# Patient Record
Sex: Female | Born: 1995 | Race: Black or African American | Hispanic: No | Marital: Married | Smoking: Never smoker
Health system: Southern US, Community
[De-identification: ages and names within clinical notes are randomized; demographics above are authoritative.]

---

## 2018-10-31 ENCOUNTER — Other Ambulatory Visit: Payer: Self-pay | Admitting: Family Medicine

## 2018-10-31 DIAGNOSIS — N6310 Unspecified lump in the right breast, unspecified quadrant: Secondary | ICD-10-CM

## 2018-11-05 ENCOUNTER — Ambulatory Visit
Admission: RE | Admit: 2018-11-05 | Discharge: 2018-11-05 | Disposition: A | Discharge status: Home / Self Care | Payer: BC Managed Care – PPO | Source: Ambulatory Visit | Attending: Family Medicine | Admitting: Family Medicine

## 2018-11-05 ENCOUNTER — Other Ambulatory Visit: Payer: Self-pay | Admitting: Family Medicine

## 2018-11-05 ENCOUNTER — Other Ambulatory Visit: Payer: Self-pay

## 2018-11-05 DIAGNOSIS — N6310 Unspecified lump in the right breast, unspecified quadrant: Secondary | ICD-10-CM

## 2019-02-06 ENCOUNTER — Other Ambulatory Visit: Payer: BC Managed Care – PPO

## 2019-05-08 ENCOUNTER — Other Ambulatory Visit: Payer: BC Managed Care – PPO

## 2021-02-24 ENCOUNTER — Ambulatory Visit (INDEPENDENT_AMBULATORY_CARE_PROVIDER_SITE_OTHER): Payer: BLUE CROSS/BLUE SHIELD | Admitting: Allergy & Immunology

## 2021-02-24 ENCOUNTER — Encounter: Payer: Self-pay | Admitting: Allergy & Immunology

## 2021-02-24 ENCOUNTER — Other Ambulatory Visit: Payer: Self-pay

## 2021-02-24 VITALS — BP 124/70 | HR 80 | Temp 97.6°F | Resp 18 | Ht 64.0 in | Wt 161.8 lb

## 2021-02-24 DIAGNOSIS — L508 Other urticaria: Secondary | ICD-10-CM | POA: Insufficient documentation

## 2021-02-24 DIAGNOSIS — J3089 Other allergic rhinitis: Secondary | ICD-10-CM

## 2021-02-24 DIAGNOSIS — J302 Other seasonal allergic rhinitis: Secondary | ICD-10-CM

## 2021-02-24 DIAGNOSIS — T7840XA Allergy, unspecified, initial encounter: Secondary | ICD-10-CM | POA: Diagnosis not present

## 2021-02-24 DIAGNOSIS — T7840XD Allergy, unspecified, subsequent encounter: Secondary | ICD-10-CM

## 2021-02-24 NOTE — Patient Instructions (Addendum)
1. Chronic urticaria - We are going to hold off on labs today. - We will see if your allergies that were positive today account for control of your symptoms. - We can certainly do testing in the future if needed. - I would start a daily antihistamine and try to be more consistent with it to suppress these reactions and treat your allergies.   2. Chronic rhinitis - Testing today showed: grasses, ragweed, weeds, trees, indoor molds, dust mites, cat, and dog. - Copy of test results provided.  - Avoidance measures provided. - Continue with: a daily antihistamine (alternate every 3 months in case you develop tolerance to one) - Start taking: Nasacort (triamcinolone) one spray per nostril daily - You can use an extra dose of the antihistamine, if needed, for breakthrough symptoms.  - Consider nasal saline rinses 1-2 times daily to remove allergens from the nasal cavities as well as help with mucous clearance (this is especially helpful to do before the nasal sprays are given) - Consider allergy shots as a means of long-term control. - Allergy shots "re-train" and "reset" the immune system to ignore environmental allergens and decrease the resulting immune response to those allergens (sneezing, itchy watery eyes, runny nose, nasal congestion, etc).    - Allergy shots improve symptoms in 75-85% of patients.  - We can discuss more at the next appointment if the medications are not working for you.  3. Allergic reaction - I think you reactions are likely from the dust mite exposure. - Get dust mite covers at least for the pillows. - Testing to the most common food allergens as well as apple were negative. - There is a the low positive predictive value of food allergy testing and hence the high possibility of false positives. - In contrast, food allergy testing has a high negative predictive value, therefore if testing is negative we can be relatively assured that they are indeed negative.   4. Return  in about 6 weeks (around 04/07/2021).    Please inform us of any Emergency Department visits, hospitalizations, or changes in symptoms. Call us before going to the ED for breathing or allergy symptoms since we might be able to fit you in for a sick visit. Feel free to contact us anytime with any questions, problems, or concerns.  It was a pleasure to meet you today!  Websites that have reliable patient information: 1. American Academy of Asthma, Allergy, and Immunology: www.aaaai.org 2. Food Allergy Research and Education (FARE): foodallergy.org 3. Mothers of Asthmatics: http://www.asthmacommunitynetwork.org 4. American College of Allergy, Asthma, and Immunology: www.acaai.org   COVID-19 Vaccine Information can be found at: [email or web address] For questions related to vaccine distribution or appointments, please email [email or web address] or call [phone].   We realize that you might be concerned about having an allergic reaction to the COVID19 vaccines. To help with that concern, WE ARE OFFERING THE COVID19 VACCINES IN OUR OFFICE! Ask the front desk for dates!     Like [place] on [employer] and Instagram for our latest updates!      A healthy democracy works best when [employer] participate! Make sure you are registered to vote! If you have moved or changed any of your contact information, you will need to get this updated before voting!  In some cases, you MAY be able to register to vote online: [email or web address]    Airborne Adult Perc - 02/24/21 0932     Number of Test 59    Panel 1 Select    1.  Control-Buffer 50% Glycerol Negative    2. Control-Histamine 1 mg/ml 2+    3. Albumin saline Negative    4. Bahia 2+    5. [place] Negative    6. Johnson 3+    7. Kentucky Blue 3+    8. Meadow Fescue 3+    9. Perennial Rye 3+    10. Sweet Vernal 2+    11. Timothy Negative    12. Cocklebur  Negative    13. Burweed Marshelder Negative    14. Ragweed, short Negative    15. Ragweed, Giant Negative    16. Plantain,  English Negative    17. Lamb's Quarters Negative    18. Sheep Sorrell Negative    19. Rough Pigweed Negative    20. Marsh Elder, Rough Negative    21. Mugwort, Common 3+    22. Ash mix Negative    23. Birch mix 3+    24. Beech American 3+    25. Box, Elder Negative    26. Cedar, red Negative    27. Cottonwood, Guinea-[place]issau Negative    28. Elm mix Negative    29. Hickory Negative    30. Maple mix Negative    31. Oak, [place] mix Negative    32. Pecan Pollen Negative    33. Pine mix Negative    34. Sycamore Eastern Negative    35. Walnut, Black Pollen Negative    36. Alternaria alternata Negative    37. Cladosporium Herbarum Negative    38. Aspergillus mix Negative    39. Penicillium mix Negative    40. Bipolaris sorokiniana (Helminthosporium) Negative    41. Drechslera spicifera (Curvularia) Negative    42. Mucor plumbeus Negative    43. Fusarium moniliforme Negative    44. Aureobasidium pullulans (pullulara) Negative    45. Rhizopus oryzae Negative    46. Botrytis cinera Negative    47. Epicoccum nigrum Negative    48. Phoma betae Negative    49. Candida Albicans Negative    50. Trichophyton mentagrophytes Negative    51. Mite, D Farinae  5,000 AU/ml Negative    52. Mite, D Pteronyssinus  5,000 AU/ml Negative    53. Cat Hair 10,000 BAU/ml Negative    54.  Dog Epithelia Negative    55. Mixed Feathers Negative    56. Horse Epithelia Negative    57. Cockroach, German Negative    58. Mouse Negative    59. Tobacco Leaf Negative             Intradermal - 02/24/21 1039     Time Antigen Placed 1020    Allergen Manufacturer Greer    Location Arm    Number of Test 11    Intradermal Select    Control Negative    [place] 2+    Ragweed mix 1+    Mold 1 Negative    Mold 2 Negative    Mold 3 Negative    Mold 4 1+    Cat 4+    Dog 3+    Cockroach  Negative    Mite mix 2+             Food Adult Perc - 02/24/21 1000     Control-Histamine 1 mg/ml 2+    1. Peanut Negative    2. Soybean Negative    3. Wheat Negative    4. Sesame Negative    5. Milk, cow Negative    6. Egg White, Chicken Negative  7. Casein Negative    8. Shellfish Mix Negative    9. Fish Mix Negative    10. Cashew Negative    11. Pecan Food Negative    12. Walnut Food Negative    13. Almond Negative    14. Hazelnut Negative    15. [place] nut Negative    16. Coconut Negative    17. Pistachio Negative    58. Apple Negative             Reducing Pollen Exposure  The American Academy of Allergy, Asthma and Immunology suggests the following steps to reduce your exposure to pollen during allergy seasons.    Do not hang sheets or clothing out to dry; pollen may collect on these items. Do not mow lawns or spend time around freshly cut grass; mowing stirs up pollen. Keep windows closed at night.  Keep car windows closed while driving. Minimize morning activities outdoors, a time when pollen counts are usually at their highest. Stay indoors as much as possible when pollen counts or humidity is high and on windy days when pollen tends to remain in the air longer. Use air conditioning when possible.  Many air conditioners have filters that trap the pollen spores. Use a HEPA room air filter to remove pollen form the indoor air you breathe.  Control of Dust Mite Allergen    Dust mites play a major role in allergic asthma and rhinitis.  They occur in environments with high humidity wherever human skin is found.  Dust mites absorb humidity from the atmosphere (ie, they do not drink) and feed on organic matter (including shed human and animal skin).  Dust mites are a microscopic type of insect that you cannot see with the naked eye.  High levels of dust mites have been detected from mattresses, pillows, carpets, upholstered furniture, bed covers, clothes, soft  toys and any woven material.  The principal allergen of the dust mite is found in its feces.  A gram of dust may contain 1,000 mites and 250,000 fecal particles.  Mite antigen is easily measured in the air during house cleaning activities.  Dust mites do not bite and do not cause harm to humans, other than by triggering allergies/asthma.    Ways to decrease your exposure to dust mites in your home:  Encase mattresses, box springs and pillows with a mite-impermeable barrier or cover   Wash sheets, blankets and drapes weekly in hot water (130 F) with detergent and dry them in a dryer on the hot setting.  Have the room cleaned frequently with a vacuum cleaner and a damp dust-mop.  For carpeting or rugs, vacuuming with a vacuum cleaner equipped with a high-efficiency particulate air (HEPA) filter.  The dust mite allergic individual should not be in a room which is being cleaned and should wait 1 hour after cleaning before going into the room. Do not sleep on upholstered furniture (eg, couches).   If possible removing carpeting, upholstered furniture and drapery from the home is ideal.  Horizontal blinds should be eliminated in the rooms where the person spends the most time (bedroom, study, television room).  Washable vinyl, roller-type shades are optimal. Remove all non-washable stuffed toys from the bedroom.  Wash stuffed toys weekly like sheets and blankets above.   Reduce indoor humidity to less than 50%.  Inexpensive humidity monitors can be purchased at most hardware stores.  Do not use a humidifier as can make the problem worse and are not recommended.  Control of Dog or Cat Allergen  Avoidance is the best way to manage a dog or cat allergy. If you have a dog or cat and are allergic to dog or cats, consider removing the dog or cat from the home. If you have a dog or cat but dont want to find it a new home, or if your family wants a pet even though someone in the household is allergic, here are  some strategies that may help keep symptoms at bay:  Keep the pet out of your bedroom and restrict it to only a few rooms. Be advised that keeping the dog or cat in only one room will not limit the allergens to that room. Dont pet, hug or kiss the dog or cat; if you do, wash your hands with soap and water. High-efficiency particulate air (HEPA) cleaners run continuously in a bedroom or living room can reduce allergen levels over time. Regular use of a high-efficiency vacuum cleaner or a central vacuum can reduce allergen levels. Giving your dog or cat a bath at least once a week can reduce airborne allergen.

## 2021-02-24 NOTE — Progress Notes (Signed)
NEW PATIENT  Date of Service/Encounter:  02/24/21  Consult requested by: Virgilio Belling, PA-C   Assessment:   Chronic urticaria  Seasonal and perennial allergic rhinitis (grasses, ragweed, weeds, trees, indoor molds, dust mites, cat, and dog)  Allergic reaction - unknown trigger but maybe dust mites?   Plan/Recommendations:   1. Chronic urticaria - We are going to hold off on labs today. - We will see if your allergies that were positive today account for control of your symptoms. - We can certainly do testing in the future if needed. - I would start a daily antihistamine and try to be more consistent with it to suppress these reactions and treat your allergies.   2. Chronic rhinitis - Testing today showed: grasses, ragweed, weeds, trees, indoor molds, dust mites, cat, and dog. - Copy of test results provided.  - Avoidance measures provided. - Continue with: a daily antihistamine (alternate every 3 months in case you develop tolerance to one) - Start taking: Nasacort (triamcinolone) one spray per nostril daily - You can use an extra dose of the antihistamine, if needed, for breakthrough symptoms.  - Consider nasal saline rinses 1-2 times daily to remove allergens from the nasal cavities as well as help with mucous clearance (this is especially helpful to do before the nasal sprays are given) - Consider allergy shots as a means of long-term control. - Allergy shots "re-train" and "reset" the immune system to ignore environmental allergens and decrease the resulting immune response to those allergens (sneezing, itchy watery eyes, runny nose, nasal congestion, etc).    - Allergy shots improve symptoms in 75-85% of patients.  - We can discuss more at the next appointment if the medications are not working for you.  3. Allergic reaction - I think you reactions are likely from the dust mite exposure. - Get dust mite covers at least for the pillows. - Testing to the most  common food allergens as well as apple were negative. - There is a the low positive predictive value of food allergy testing and hence the high possibility of false positives. - In contrast, food allergy testing has a high negative predictive value, therefore if testing is negative we can be relatively assured that they are indeed negative.   4. Return in about 6 weeks (around 04/07/2021).    This note in its entirety was forwarded to the Provider who requested this consultation.  Subjective:   Laura Barker is a 25 y.o. female presenting today for evaluation of  Chief Complaint  Patient presents with   Urticaria   Allergy Testing    Environmental Food???    Laura Barker has a history of the following: Patient Active Problem List   Diagnosis Date Noted   Chronic urticaria 02/24/2021   Seasonal and perennial allergic rhinitis 02/24/2021   Allergic reaction 02/24/2021    History obtained from: chart review and patient.  Laura Barker was referred by Virgilio Belling, PA-C.     Laura Barker is a 25 y.o. female presenting for an evaluation of allergic reactions and lip swelling . 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    Asthma/Respiratory Symptom History: She had asthma as a child. She had bronchitis in college around 3-4 years. She went to [employer] [facility] [employer] [facility] [employer] and greww up in [place]. She has not needed an inhaler since that time at all.  She has no issues with working out and coughing at night.   Allergic Rhinitis Symptom History: She recently moved  into a new house in July 2022. She does have environmental allergies including dust and pollen. She does dust often and she changes the filters. She has carpeting in her bedroom and hardwood flooring everywhere else.  She takes an OTC antihistamine like Benadryl or Zyrtec. She does have a nose spray but she does not use routinely. She has never been allergy tested.   Food Allergy Symptom History: In September  2022, she tried an Musician. She woke up the next morning and she had lip swelling. She drank it around 8pm the night prior. She did not start any new medications. She never changed her diet.  She had episodes on December 10th and 19th. These WERE NOT associated with the ciders. But these most recent two only included her eyes and not her lips. These are always in the morning. There are no animals in the home. These were tingling if anything. She went to see her PCP. The swelling sticks around for a period of around 4 or 5 pm that evening, lasting an entire. She treated with Benadryl. She never needed prednisone. She is eating more steak than ground beef. She denies tick bites, but she did go to the [place] in August and she had some bites, which she presumed were ants or bed bugs.   Skin Symptom History: She does break out in hives when the seasons change. She has hives over her hands and body. She has gone to the dermatologist and this was diagnosed as hand dermatitis. She has put Aquaphor as  well as an anti itching cream.   Otherwise, there is no history of other atopic diseases, including asthma, drug allergies, stinging insect allergies, or contact dermatitis. There is no significant infectious history. Vaccinations are up to date.     Past Medical History: Patient Active Problem List   Diagnosis Date Noted   Chronic urticaria 02/24/2021   Seasonal and perennial allergic rhinitis 02/24/2021   Allergic reaction 02/24/2021    Medication List:  Allergies as of 02/24/2021   No Known Allergies      Medication List        Accurate as of February 24, 2021  1:18 PM. If you have any questions, ask your nurse or doctor.          cetirizine 10 MG tablet Commonly known as: ZYRTEC Take 1 tablet by mouth as needed.   Lo Loestrin Fe 1 MG-10 MCG / 10 MCG tablet Generic drug: Norethindrone-Ethinyl Estradiol-Fe Biphas Take 1 tablet by mouth daily.        Birth  History: non-contributory  Developmental History: non-contributory  Past Surgical History: History reviewed. No pertinent surgical history.   Family History: History reviewed. No pertinent family history.   Social History: Aqua lives at home with her partner. They live in a town home that is [address] years old. There are hardwood floors in the main living areas and carpeting in the bedrooms. They have gas heating and central cooling. There are dust mite coverings on the bedding at all. There is no tobacco exposure. 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 There is no HEPA filter in the home.    Review of Systems  Constitutional: Negative.  Negative for chills, fever, malaise/fatigue and weight loss.  HENT: Negative.  Negative for congestion, ear discharge, ear pain and sore throat.   Eyes:  Negative for pain, discharge and redness.  Respiratory:  Positive for cough. Negative for sputum production, shortness of breath  and wheezing.   Cardiovascular: Negative.  Negative for chest pain and palpitations.  Gastrointestinal:  Negative for abdominal pain, constipation, diarrhea, heartburn, nausea and vomiting.  Skin: Negative.  Negative for itching and rash.       Positive for lip swelling.   Neurological:  Negative for dizziness and headaches.  Endo/Heme/Allergies:  Negative for environmental allergies. Does not bruise/bleed easily.      Objective:   Blood pressure 124/70, pulse 80, temperature 97.6 F (36.4 C), resp. rate 18, height  (1.626 m), weight 161 lb 12.8 oz (73.4 kg), SpO2 98 %. Body mass index is 27.77 kg/m.   Physical Exam:   Physical Exam Vitals reviewed.  Constitutional:      Appearance: She is well-developed.  HENT:     Head: Normocephalic and atraumatic.     Right Ear: Tympanic membrane, ear canal and external ear normal. No drainage, swelling or tenderness. Tympanic membrane is not injected, scarred, erythematous, retracted or bulging.      Left Ear: Tympanic membrane, ear canal and external ear normal. No drainage, swelling or tenderness. Tympanic membrane is not injected, scarred, erythematous, retracted or bulging.     Nose: Mucosal edema and rhinorrhea present. No nasal deformity or septal deviation.     Right Turbinates: Enlarged, swollen and pale.     Left Turbinates: Enlarged, swollen and pale.     Right Sinus: No maxillary sinus tenderness or frontal sinus tenderness.     Left Sinus: No maxillary sinus tenderness or frontal sinus tenderness.     Mouth/Throat:     Mouth: Mucous membranes are not pale and not dry.     Pharynx: Uvula midline.  Eyes:     General:        Right eye: No discharge.        Left eye: No discharge.     Conjunctiva/sclera: Conjunctivae normal.     Right eye: Right conjunctiva is not injected. No chemosis.    Left eye: Left conjunctiva is not injected. No chemosis.    Pupils: Pupils are equal, round, and reactive to light.  Cardiovascular:     Rate and Rhythm: Normal rate and regular rhythm.     Heart sounds: Normal heart sounds.  Pulmonary:     Effort: Pulmonary effort is normal. No tachypnea, accessory muscle usage or respiratory distress.     Breath sounds: Normal breath sounds. No wheezing, rhonchi or rales.     Comments: Moving air well in all lung fields. No increased work of breathing.  Chest:     Chest wall: No tenderness.  Abdominal:     Tenderness: There is no abdominal tenderness. There is no guarding or rebound.  Lymphadenopathy:     Head:     Right side of head: No submandibular, tonsillar or occipital adenopathy.     Left side of head: No submandibular, tonsillar or occipital adenopathy.     Cervical: No cervical adenopathy.  Skin:    General: Skin is warm.     Capillary Refill: Capillary refill takes less than 2 seconds.     Coloration: Skin is not pale.     Findings: No abrasion, erythema, petechiae or rash. Rash is not papular, urticarial or vesicular.   Neurological:     Mental Status: She is alert.  Psychiatric:        Behavior: Behavior is cooperative.     Diagnostic studies:    Allergy Studies:     Airborne Adult Perc - 02/24/21 0932  Number of Test 59    Panel 1 Select    1. Control-Buffer 50% Glycerol Negative    2. Control-Histamine 1 mg/ml 2+    3. Albumin saline Negative    4. Bahia 2+    5. French [place] Territ[place] Negative    6. Johnson 3+    7. Kentucky Blue 3+    8. Meadow Fescue 3+    9. Perennial Rye 3+    10. Sweet Vernal 2+    11. Timothy Negative    12. Cocklebur Negative    13. Burweed Marshelder Negative    14. Ragweed, short Negative    15. Ragweed, Giant Negative    16. Plantain,  English Negative    17. Lamb'Laura Quarters Negative    18. Sheep Sorrell Negative    19. Rough Pigweed Negative    20. Marsh Elder, Rough Negative    21. Mugwort, Common 3+    22. Ash mix Negative    23. Birch mix 3+    24. Beech American 3+    25. Box, Elder Negative    26. Cedar, red Negative    27. Cottonwood, Guinea-[place]issau Negative    28. Elm mix Negative    29. Hickory Negative    30. Maple mix Negative    31. Oak, [place] mix Negative    32. Pecan Pollen Negative    33. Pine mix Negative    34. Sycamore Eastern Negative    35. Walnut, Black Pollen Negative    36. Alternaria alternata Negative    37. Cladosporium Herbarum Negative    38. Aspergillus mix Negative    39. Penicillium mix Negative    40. Bipolaris sorokiniana (Helminthosporium) Negative    41. Drechslera spicifera (Curvularia) Negative    42. Mucor plumbeus Negative    43. Fusarium moniliforme Negative    44. Aureobasidium pullulans (pullulara) Negative    45. Rhizopus oryzae Negative    46. Botrytis cinera Negative    47. Epicoccum nigrum Negative    48. Phoma betae Negative    49. Candida Albicans Negative    50. Trichophyton mentagrophytes Negative    51. Mite, D Farinae  5,000 AU/ml Negative    52. Mite, D Pteronyssinus  5,000 AU/ml Negative    53.  Cat Hair 10,000 BAU/ml Negative    54.  Dog Epithelia Negative    55. Mixed Feathers Negative    56. Horse Epithelia Negative    57. Cockroach, German Negative    58. Mouse Negative    59. Tobacco Leaf Negative             Intradermal - 02/24/21 1039     Time Antigen Placed 1020    Allergen Manufacturer Greer    Location Arm    Number of Test 11    Intradermal Select    Control Negative    [place] 2+    Ragweed mix 1+    Mold 1 Negative    Mold 2 Negative    Mold 3 Negative    Mold 4 1+    Cat 4+    Dog 3+    Cockroach Negative    Mite mix 2+             Food Adult Perc - 02/24/21 1000     Control-Histamine 1 mg/ml 2+    1. Peanut Negative    2. Soybean Negative    3. Wheat Negative    4. Sesame Negative    5. Milk,  cow Negative    6. Egg White, Chicken Negative    7. Casein Negative    8. Shellfish Mix Negative    9. Fish Mix Negative    10. Cashew Negative    11. Pecan Food Negative    12. Walnut Food Negative    13. Almond Negative    14. Hazelnut Negative    15. [place] nut Negative    16. Coconut Negative    17. Pistachio Negative    58. Apple Negative             Allergy testing results were read and interpreted by myself, documented by clinical staff.         Malachi Bonds, MD [facility]

## 2021-03-21 IMAGING — US US BREAST*R* LIMITED INC AXILLA
1 series · 5 of 5 positions shown · non-contrast
Comparison: None

CLINICAL DATA: Patient presents for palpable abnormality within the
outer right breast.

EXAM:
ULTRASOUND OF THE RIGHT BREAST

[Series 1: us breast*right* limited inc axilla · 0.07mm/px · 5 of 5 slices shown]
[im 1/5]
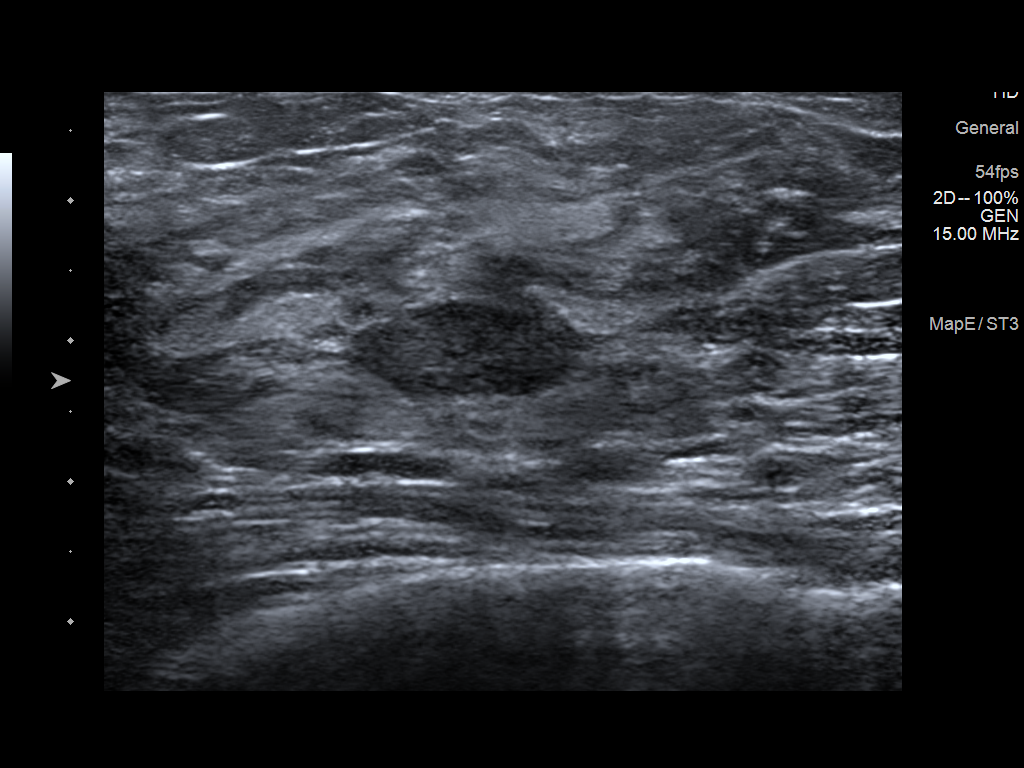
[im 2/5]
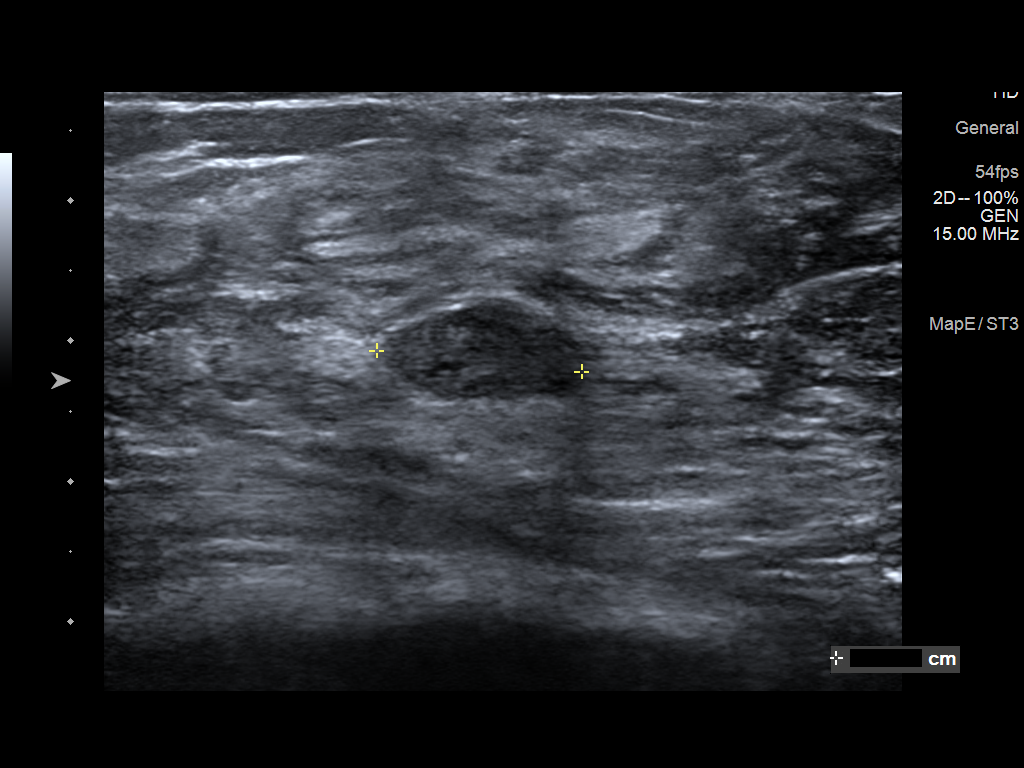
[im 3/5]
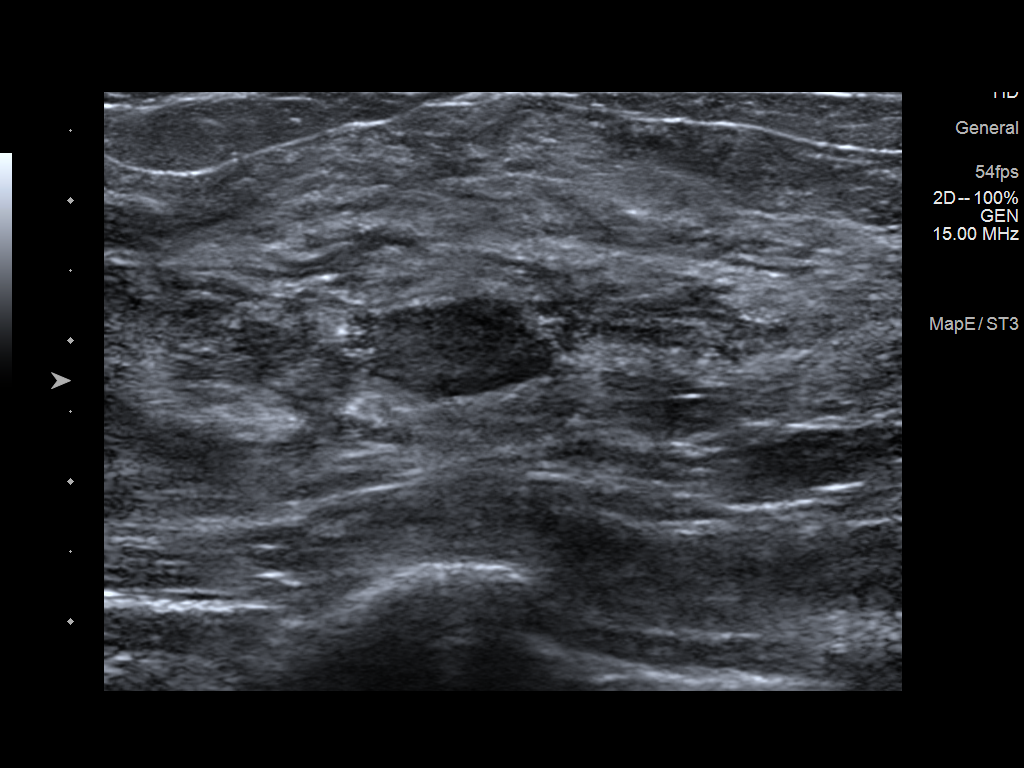
[im 4/5]
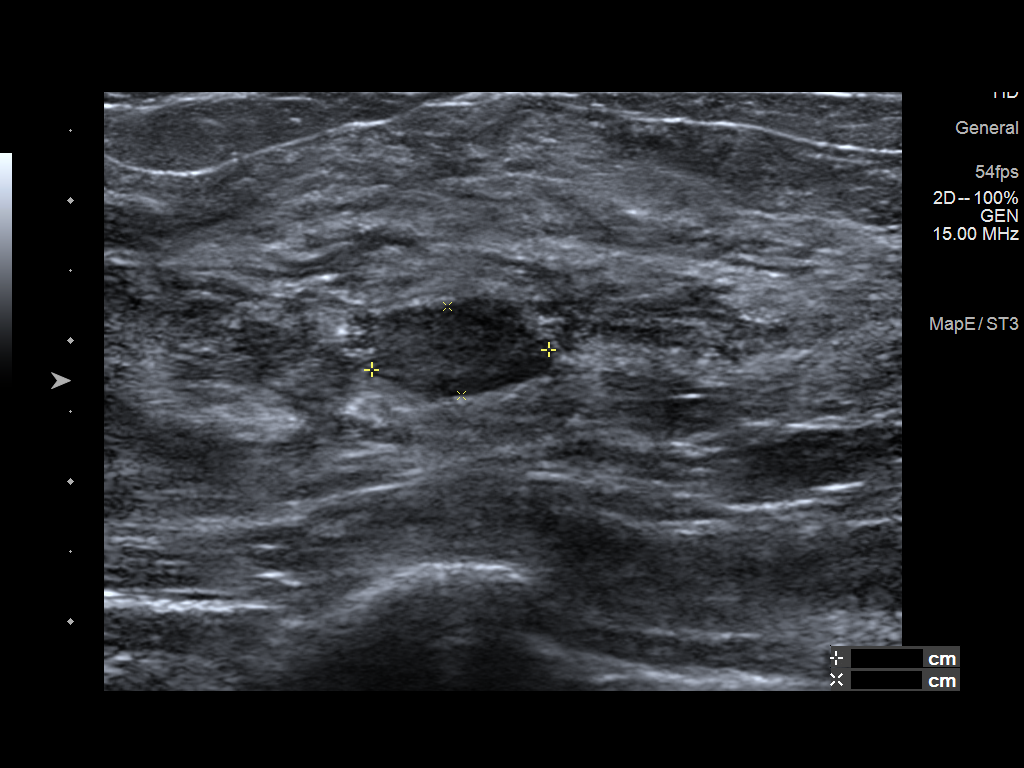
[im 5/5]
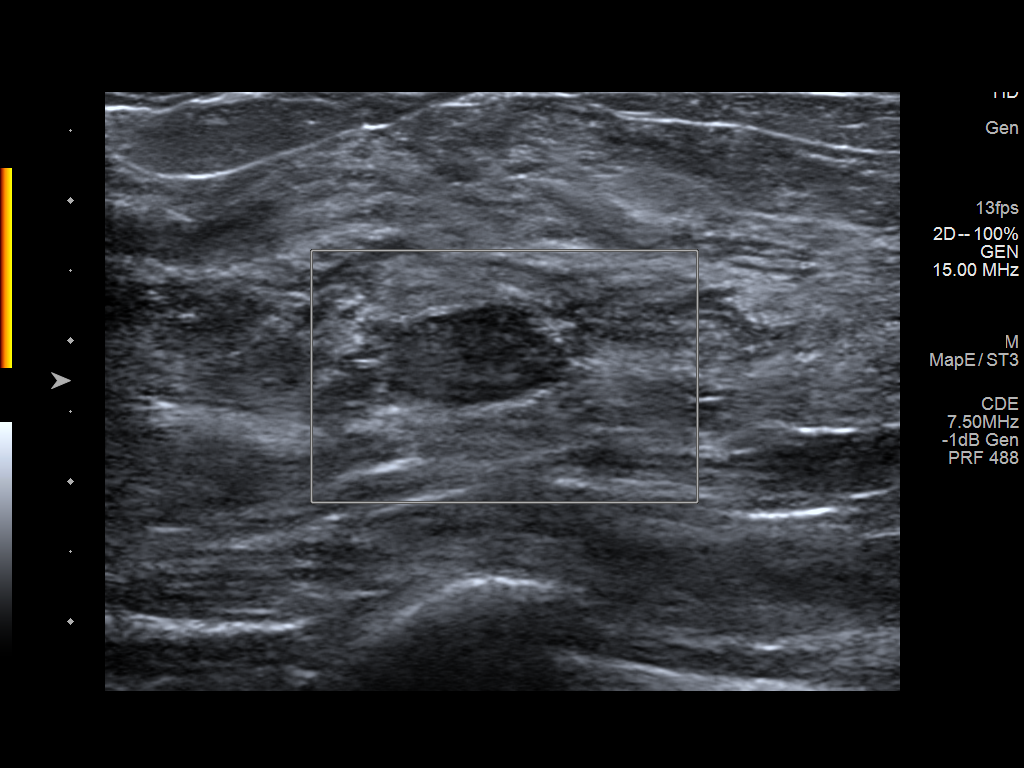

[5 of 5 positions shown; findings below may reference images not displayed]

FINDINGS: On physical exam, dense tissue is palpated within the outer right
breast.

Targeted ultrasound is performed, showing a 1.3 x 0.6 x 1.5 cm oval
circumscribed hypoechoic mass right breast 10 o'clock position 4 cm
from nipple.
IMPRESSION: Probably benign right breast mass, favored to represent a
fibroadenoma.

RECOMMENDATION:
We discussed management options including excision,
ultrasound-guided core biopsy, and short term interval follow-up.
Follow-up ultrasound is recommended at 6, 12,and 24 months to assess
stability. The patient concurs with this plan.

I have discussed the findings and recommendations with the patient.
Results were also provided in writing at the conclusion of the
visit. If applicable, a reminder letter will be sent to the patient
regarding the next appointment.

BI-RADS CATEGORY  3: Probably benign.

## 2022-05-01 ENCOUNTER — Ambulatory Visit
Admission: EM | Admit: 2022-05-01 | Discharge: 2022-05-01 | Disposition: A | Discharge status: Home / Self Care | Payer: BLUE CROSS/BLUE SHIELD | Attending: Urgent Care | Admitting: Urgent Care

## 2022-05-01 DIAGNOSIS — Z1152 Encounter for screening for COVID-19: Secondary | ICD-10-CM | POA: Insufficient documentation

## 2022-05-01 DIAGNOSIS — B349 Viral infection, unspecified: Secondary | ICD-10-CM | POA: Insufficient documentation

## 2022-05-01 DIAGNOSIS — J4530 Mild persistent asthma, uncomplicated: Secondary | ICD-10-CM | POA: Insufficient documentation

## 2022-05-01 MED ORDER — PREDNISONE 50 MG PO TABS: 50.0000 mg | ORAL_TABLET | Freq: Every day | ORAL | 0 refills | Status: AC

## 2022-05-01 MED ORDER — PROMETHAZINE-DM 6.25-15 MG/5ML PO SYRP: 5.0000 mL | ORAL_SOLUTION | Freq: Three times a day (TID) | ORAL | 0 refills | Status: AC | PRN

## 2022-05-01 MED ORDER — ALBUTEROL SULFATE HFA 108 (90 BASE) MCG/ACT IN AERS: 1.0000 | INHALATION_SPRAY | Freq: Four times a day (QID) | RESPIRATORY_TRACT | 0 refills | Status: AC | PRN

## 2022-05-01 NOTE — ED Triage Notes (Signed)
Pt reports she has a sore throat, sinus pressure, and chest congestion x 2 days

## 2022-05-01 NOTE — Discharge Instructions (Signed)
We will notify you of your test results as they arrive and may take between about 24 hours.  I encourage you to sign up for MyChart if you have not already done so as this can be the easiest way for [place] to communicate results to you online or through a phone app.  Generally, we only contact you if it is a positive test result.  In the meantime, if you develop worsening symptoms including fever, chest pain, shortness of breath despite our current treatment plan then please report to the emergency room as this may be a sign of worsening status from possible viral infection.  Otherwise, we will manage this as a viral syndrome. For sore throat or cough try using a honey-based tea. Use 3 teaspoons of honey with juice squeezed from half lemon. Place shaved pieces of ginger into 1/2-1 cup of water and warm over stove top. Then mix the ingredients and repeat every 4 hours as needed. Please take Tylenol '500mg'$ -'650mg'$  every 6 hours for aches and pains, fevers. Hydrate very well with at least 2 liters of water. Eat light meals such as soups to replenish electrolytes and soft fruits, veggies. Start an antihistamine like Zyrtec ('10mg'$  daily) for postnasal drainage, sinus congestion.  You can take this together with prednisone and albuterol inhaler.  Use the cough medications as needed.

## 2022-05-01 NOTE — ED Provider Notes (Signed)
[facility]  Note:  This document was prepared using Systems analyst and may include unintentional dictation errors.  MRN: [id number] DOB: 03-29-1995  Subjective:   Laura Barker is a 27 y.o. female presenting for 2-day history of sinus congestion, sinus pressure, chest congestion, throat pain.  No shortness of breath, wheezing.  No smoking of any kind.  No current facility-administered medications for this encounter.  Current Outpatient Medications:    cetirizine (ZYRTEC) 10 MG tablet, Take 1 tablet by mouth as needed., Disp: , Rfl:    LO LOESTRIN FE 1 MG-10 MCG / 10 MCG tablet, Take 1 tablet by mouth daily., Disp: , Rfl:    No Known Allergies  History reviewed. No pertinent past medical history.   History reviewed. No pertinent surgical history.  History reviewed. No pertinent family history.  Social History   Tobacco Use   Smoking status: Never   Smokeless tobacco: Never  Vaping Use   Vaping Use: Never used  Substance Use Topics   Alcohol use: Yes   Drug use: Never    ROS   Objective:   Vitals: BP 133/81 (BP Location: Right Arm)   Pulse 98   Temp 99.2 F (37.3 C) (Oral)   Resp 20   LMP 05/01/2022 (Exact Date)   SpO2 94%   Physical Exam Constitutional:      General: She is not in acute distress.    Appearance: Normal appearance. She is well-developed and normal weight. She is not ill-appearing, toxic-appearing or diaphoretic.  HENT:     Head: Normocephalic and atraumatic.     Right Ear: Tympanic membrane, ear canal and external ear normal. No drainage or tenderness. No middle ear effusion. There is no impacted cerumen. Tympanic membrane is not erythematous or bulging.     Left Ear: Tympanic membrane, ear canal and external ear normal. No drainage or tenderness.  No middle ear effusion. There is no impacted cerumen. Tympanic membrane is not erythematous or bulging.     Nose: Nose normal. No congestion or  rhinorrhea.     Mouth/Throat:     Mouth: Mucous membranes are moist. No oral lesions.     Pharynx: No pharyngeal swelling, oropharyngeal exudate, posterior oropharyngeal erythema or uvula swelling.     Tonsils: No tonsillar exudate or tonsillar abscesses.  Eyes:     General: No scleral icterus.       Right eye: No discharge.        Left eye: No discharge.     Extraocular Movements: Extraocular movements intact.     Right eye: Normal extraocular motion.     Left eye: Normal extraocular motion.     Conjunctiva/sclera: Conjunctivae normal.  Cardiovascular:     Rate and Rhythm: Normal rate and regular rhythm.     Heart sounds: Normal heart sounds. No murmur heard.    No friction rub. No gallop.  Pulmonary:     Effort: Pulmonary effort is normal. No respiratory distress.     Breath sounds: No stridor. No wheezing, rhonchi or rales.  Chest:     Chest wall: No tenderness.  Musculoskeletal:     Cervical back: Normal range of motion and neck supple.  Lymphadenopathy:     Cervical: No cervical adenopathy.  Skin:    General: Skin is warm and dry.  Neurological:     General: No focal deficit present.     Mental Status: She is alert and oriented to person, place, and time.  Psychiatric:  Mood and Affect: Mood normal.        Behavior: Behavior normal.     Assessment and Plan :   PDMP not reviewed this encounter.  1. Acute viral syndrome   2. Mild persistent asthma without complication     Recommended oral prednisone course, refilled albuterol inhaler for her asthma. Deferred imaging given clear cardiopulmonary exam, hemodynamically stable vital signs. Will manage for viral illness such as viral URI, viral syndrome, viral rhinitis, COVID-19. Recommended supportive care. Offered scripts for symptomatic relief. Testing is pending. Counseled patient on potential for adverse effects with medications prescribed/recommended today, ER and return-to-clinic precautions discussed, patient  verbalized understanding.   Patient should definitely undergo Paxlovid treatment should she test positive for COVID-19.   Jaynee Eagles, PA-C 05/02/22 0830

## 2022-05-02 LAB — SARS CORONAVIRUS 2 (TAT 6-24 HRS): SARS Coronavirus 2: NEGATIVE

## 2022-07-23 ENCOUNTER — Ambulatory Visit
Admission: EM | Admit: 2022-07-23 | Discharge: 2022-07-23 | Disposition: A | Discharge status: Home / Self Care | Payer: Self-pay

## 2022-07-23 DIAGNOSIS — Z711 Person with feared health complaint in whom no diagnosis is made: Secondary | ICD-10-CM

## 2022-07-23 NOTE — ED Provider Notes (Signed)
[facility]    CSN: [id number] Arrival date & time: 07/23/22  1313      History   Chief Complaint Chief Complaint  Patient presents with   Foreign Body in Vagina    HPI Laura Barker is a 27 y.o. female presents for possible retained tampon.  Patient states she presented to have the last night went out for her birthday and did have some alcoholic beverages.  Does not recall removing it and is concerned it might still be in the vaginal canal.  Denies any pain.  No other concerns at this time.   Foreign Body in Vagina    History reviewed. No pertinent past medical history.  Patient Active Problem List   Diagnosis Date Noted   Chronic urticaria 02/24/2021   Seasonal and perennial allergic rhinitis 02/24/2021   Allergic reaction 02/24/2021    History reviewed. No pertinent surgical history.  OB History   No obstetric history on file.      Home Medications    Prior to Admission medications   Medication Sig Start Date End Date Taking? Authorizing Provider  albuterol (VENTOLIN HFA) 108 (90 Base) MCG/ACT inhaler Inhale 1-2 puffs into the lungs every 6 (six) hours as needed for wheezing or shortness of breath. 05/01/22   Wallis Bamberg, PA-C  LO LOESTRIN FE 1 MG-10 MCG / 10 MCG tablet Take 1 tablet by mouth daily. 02/10/21   [provider]  predniSONE (DELTASONE) 50 MG tablet Take 1 tablet (50 mg total) by mouth daily with breakfast. 05/01/22   Wallis Bamberg, PA-C  promethazine-dextromethorphan (PROMETHAZINE-DM) 6.25-15 MG/5ML syrup Take 5 mLs by mouth 3 (three) times daily as needed for cough. 05/01/22   Wallis Bamberg, PA-C    Family History History reviewed. No pertinent family history.  Social History Social History   Tobacco Use   Smoking status: Never   Smokeless tobacco: Never  Vaping Use   Vaping Use: Never used  Substance Use Topics   Alcohol use: Yes   Drug use: Never     Allergies   Patient has no known allergies.   Review of  Systems Review of Systems  Genitourinary:        Possible retained tampon in vaginal canal     Physical Exam Triage Vital Signs ED Triage Vitals  Enc Vitals Group     BP 07/23/22 1326 116/78     Pulse Rate 07/23/22 1326 83     Resp 07/23/22 1326 18     Temp 07/23/22 1326 97.8 F (36.6 C)     Temp Source 07/23/22 1326 Oral     SpO2 07/23/22 1326 98 %     Weight --      Height --      Head Circumference --      Peak Flow --      Pain Score 07/23/22 1325 0     Pain Loc --      Pain Edu? --      Excl. in GC? --    No data found.  Updated Vital Signs BP 116/78 (BP Location: Right Arm)   Pulse 83   Temp 97.8 F (36.6 C) (Oral)   Resp 18   LMP 07/19/2022 (Approximate)   SpO2 98%   Visual Acuity Right Eye Distance:   Left Eye Distance:   Bilateral Distance:    Right Eye Near:   Left Eye Near:    Bilateral Near:     Physical Exam Vitals and nursing note reviewed.  Constitutional:      Appearance: Normal appearance.  HENT:     Head: Normocephalic and atraumatic.  Eyes:     Pupils: Pupils are equal, round, and reactive to light.  Cardiovascular:     Rate and Rhythm: Normal rate.  Pulmonary:     Effort: Pulmonary effort is normal.  Genitourinary:    Vagina: Normal.     Cervix: Cervical bleeding present. No cervical motion tenderness or friability.     Comments: No visible or palpable tampon in vaginal canal Skin:    General: Skin is warm and dry.  Neurological:     General: No focal deficit present.     Mental Status: She is alert and oriented to person, place, and time.  Psychiatric:        Mood and Affect: Mood normal.        Behavior: Behavior normal.      UC Treatments / Results  Labs (all labs ordered are listed, but only abnormal results are displayed) Labs Reviewed - No data to display  EKG   Radiology No results found.  Procedures Procedures (including critical care time)  Medications Ordered in UC Medications - No data to  display  Initial Impression / Assessment and Plan / UC Course  I have reviewed the triage vital signs and the nursing notes.  Pertinent labs & imaging results that were available during my care of the patient were reviewed by me and considered in my medical decision making (see chart for details).     Patient reassured and exam does not show any retained foreign body in vaginal canal.  Advised to follow-up with her GYN or PCP as needed Final Clinical Impressions(s) / UC Diagnoses   Final diagnoses:  Feared condition not demonstrated     Discharge Instructions      Follow-up as needed   ED Prescriptions   None    PDMP not reviewed this encounter.   Radford Pax, NP 07/23/22 1343

## 2022-07-23 NOTE — Discharge Instructions (Signed)
Follow up as needed

## 2022-07-23 NOTE — ED Triage Notes (Addendum)
Pt presents to UC w/ c/o tampon put in last night, unable to remove this morning. Does not recall removing it. Denies discharge or foul odor.
